# Patient Record
Sex: Male | Born: 2000 | Race: Black or African American | Hispanic: No | Marital: Single
Health system: Southern US, Community
[De-identification: ages and names within clinical notes are randomized; demographics above are authoritative.]

## PROBLEM LIST (undated history)

## (undated) DIAGNOSIS — F909 Attention-deficit hyperactivity disorder, unspecified type: Secondary | ICD-10-CM

## (undated) DIAGNOSIS — F819 Developmental disorder of scholastic skills, unspecified: Secondary | ICD-10-CM

## (undated) HISTORY — PX: HAND SURGERY: SHX662

## (undated) HISTORY — DX: Attention-deficit hyperactivity disorder, unspecified type: F90.9

## (undated) HISTORY — DX: Developmental disorder of scholastic skills, unspecified: F81.9

---
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
## 2020-03-08 ENCOUNTER — Encounter: Payer: Self-pay | Admitting: Pediatrics
